# Patient Record
Sex: Female | Born: 1975 | Race: White | Hispanic: No | Marital: Single | Smoking: Never smoker
Health system: Southern US, Community
[De-identification: ages and names within clinical notes are randomized; demographics above are authoritative.]

## PROBLEM LIST (undated history)

## (undated) DIAGNOSIS — G709 Myoneural disorder, unspecified: Secondary | ICD-10-CM

## (undated) DIAGNOSIS — R32 Unspecified urinary incontinence: Secondary | ICD-10-CM

## (undated) DIAGNOSIS — R159 Full incontinence of feces: Secondary | ICD-10-CM

## (undated) DIAGNOSIS — R569 Unspecified convulsions: Secondary | ICD-10-CM

## (undated) DIAGNOSIS — H6690 Otitis media, unspecified, unspecified ear: Secondary | ICD-10-CM

## (undated) DIAGNOSIS — R112 Nausea with vomiting, unspecified: Secondary | ICD-10-CM

## (undated) DIAGNOSIS — F79 Unspecified intellectual disabilities: Secondary | ICD-10-CM

## (undated) DIAGNOSIS — F89 Unspecified disorder of psychological development: Secondary | ICD-10-CM

## (undated) DIAGNOSIS — L858 Other specified epidermal thickening: Secondary | ICD-10-CM

## (undated) DIAGNOSIS — G809 Cerebral palsy, unspecified: Secondary | ICD-10-CM

## (undated) DIAGNOSIS — S82892A Other fracture of left lower leg, initial encounter for closed fracture: Secondary | ICD-10-CM

## (undated) DIAGNOSIS — Z9889 Other specified postprocedural states: Secondary | ICD-10-CM

## (undated) HISTORY — PX: MYRINGOTOMY: SUR874

## (undated) HISTORY — PX: NOVASURE ABLATION: SHX5394

## (undated) HISTORY — PX: DENTAL SURGERY: SHX609

## (undated) HISTORY — PX: WISDOM TOOTH EXTRACTION: SHX21

---

## 2006-01-15 ENCOUNTER — Ambulatory Visit: Payer: Self-pay | Admitting: Unknown Physician Specialty

## 2007-02-23 ENCOUNTER — Ambulatory Visit: Payer: Self-pay | Admitting: Pediatric Dentistry

## 2008-06-08 ENCOUNTER — Ambulatory Visit: Payer: Self-pay | Admitting: Obstetrics and Gynecology

## 2009-09-30 ENCOUNTER — Emergency Department: Payer: Self-pay | Admitting: Emergency Medicine

## 2009-10-09 ENCOUNTER — Ambulatory Visit: Payer: Self-pay | Admitting: Neurology

## 2010-10-18 IMAGING — CT CT HEAD WITHOUT CONTRAST
3 of 4 series · 18 of 30 positions shown, 20 images · non-contrast
Comparison: none

REASON FOR EXAM: new onset seizure
COMMENTS:

[Series 2: without · axial · non-contrast · 0.43mm/px · z∈[+162,+277]mm · 8 of 31 slices shown, 10 images (1 of 2)]
[im 4/31  brain]
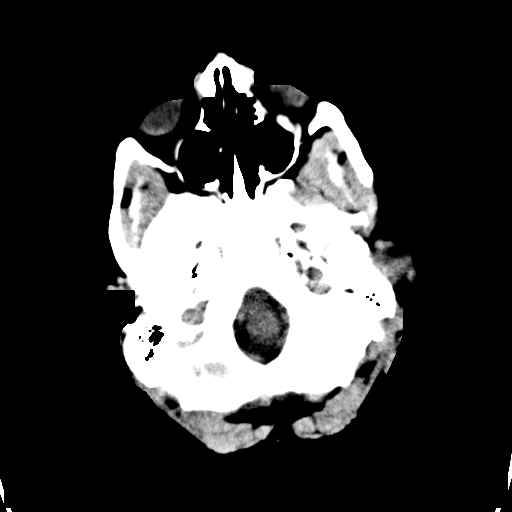
[im 4/31  bone]
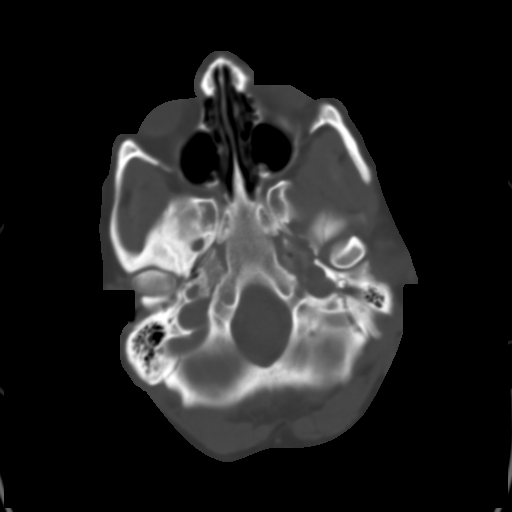
[im 7/31  brain]
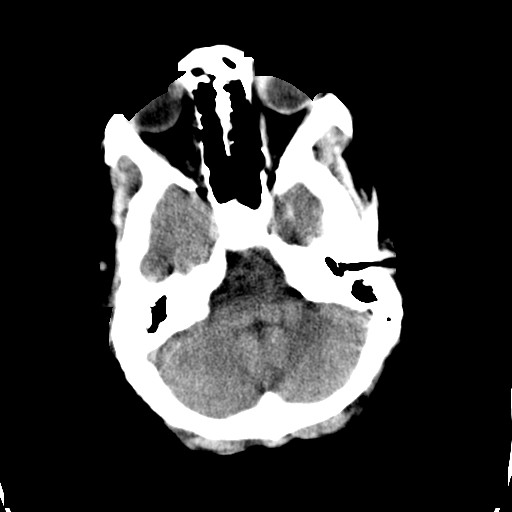
[im 11/31  brain]
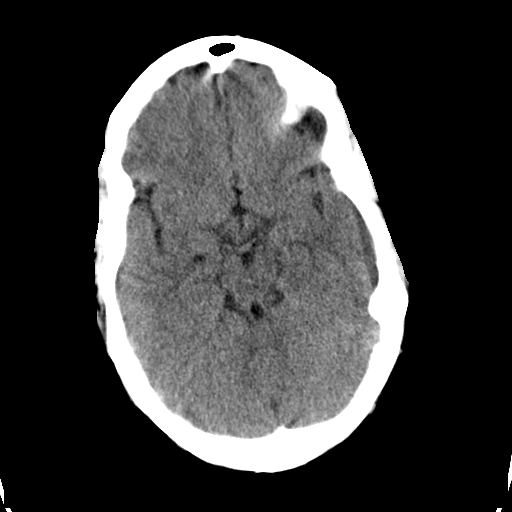
[im 14/31  brain]
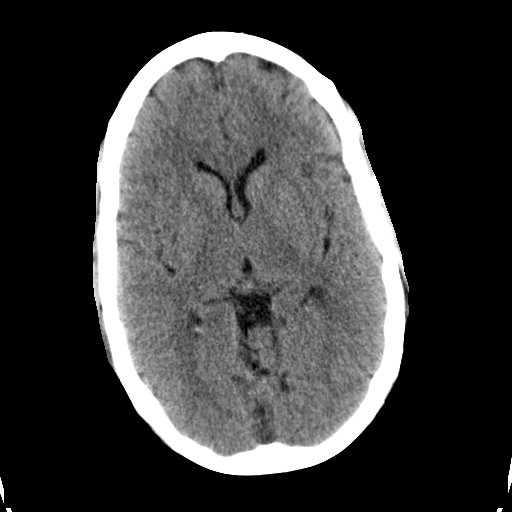
[im 17/31  brain]
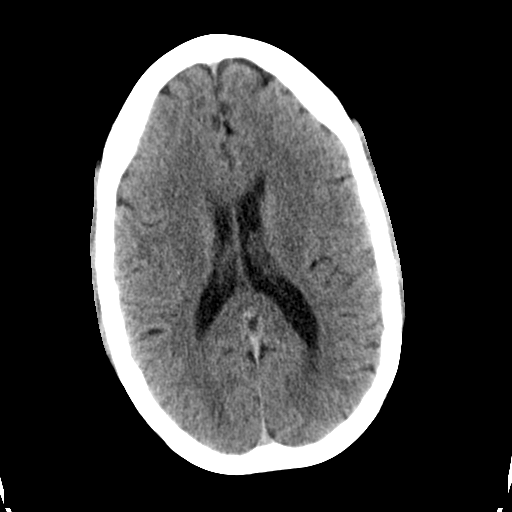
[im 17/31  bone]
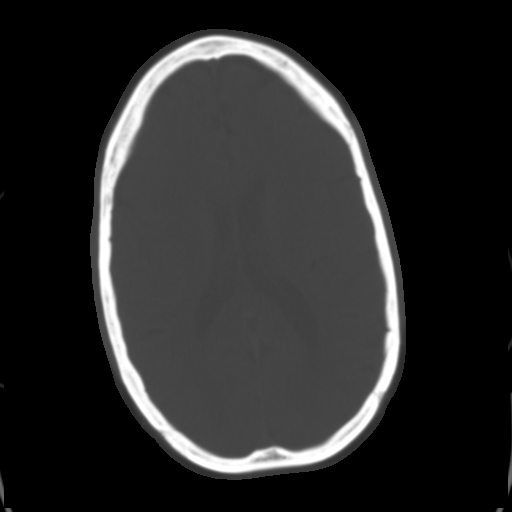
[im 21/31  brain]
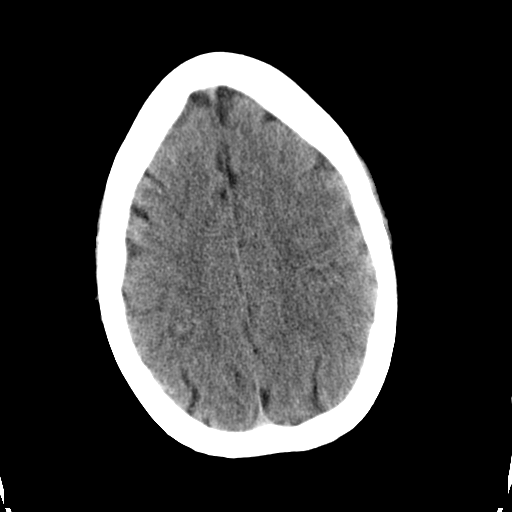
[im 24/31  brain]
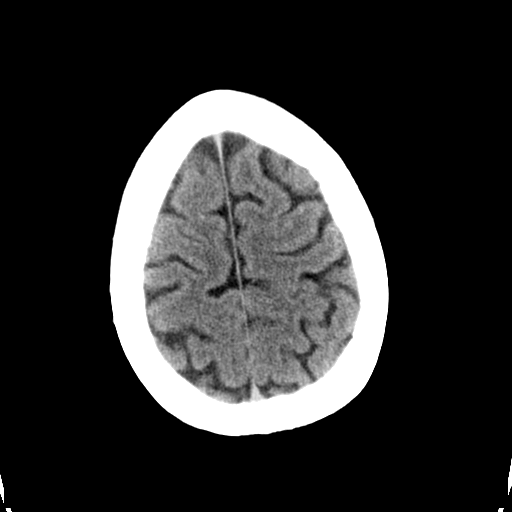
[im 27/31  brain]
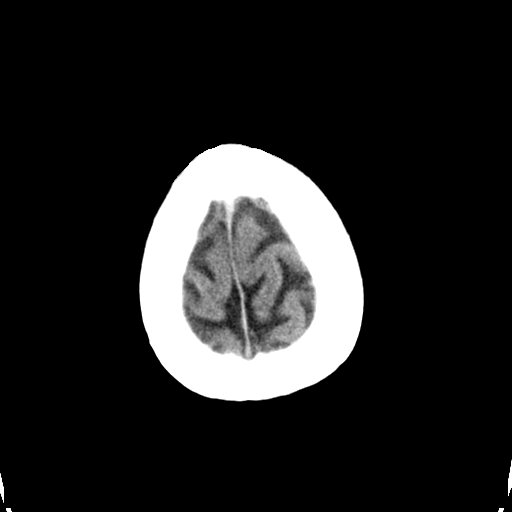

[Series 3: bone · axial · 0.43mm/px · z∈[+162,+262]mm · 7 of 31 slices shown]
[im 4/31  bone]
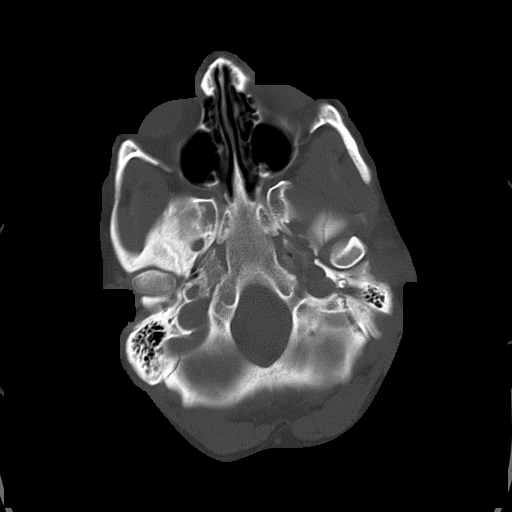
[im 7/31  bone]
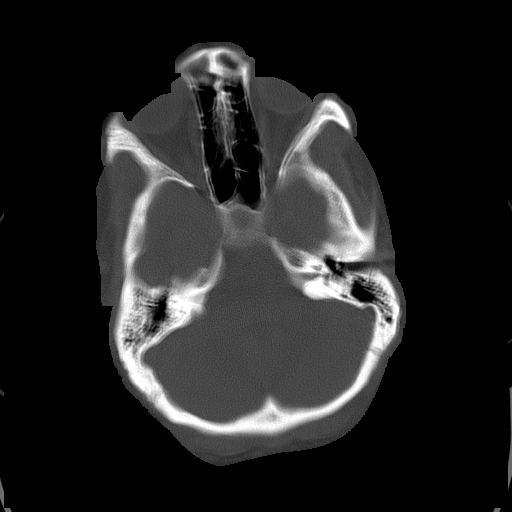
[im 11/31  bone]
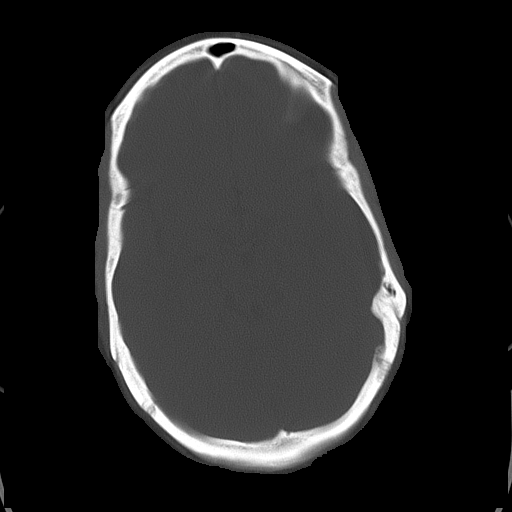
[im 14/31  bone]
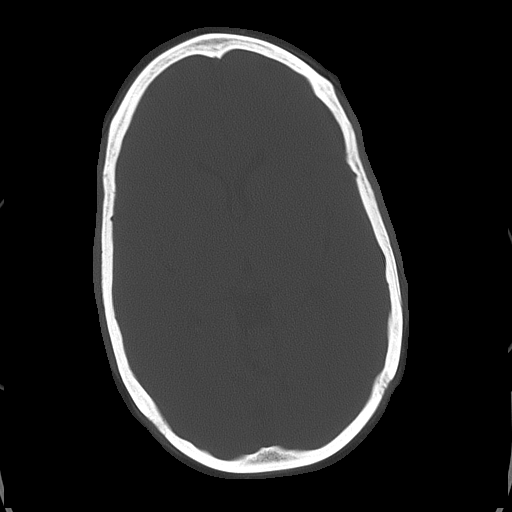
[im 17/31  bone]
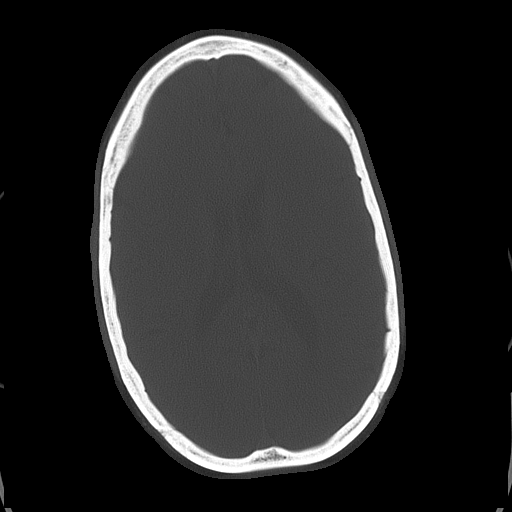
[im 21/31  bone]
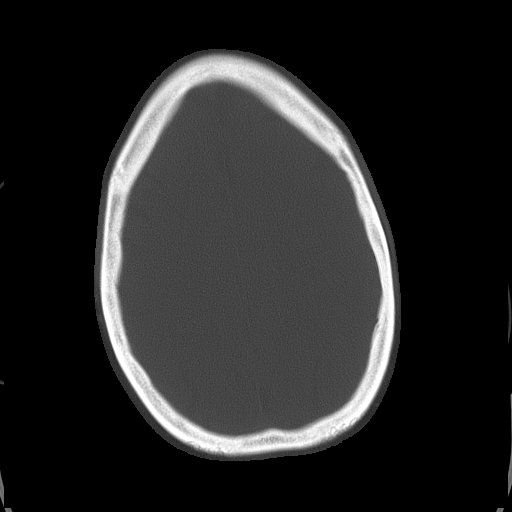
[im 24/31  bone]
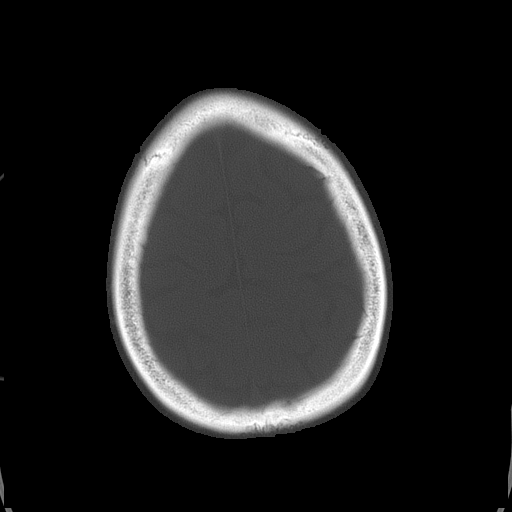

[Series 4: without · axial · non-contrast · 0.43mm/px · z∈[+162,+192]mm · 3 of 14 slices shown (2 of 2)]
[im 4/14  brain]
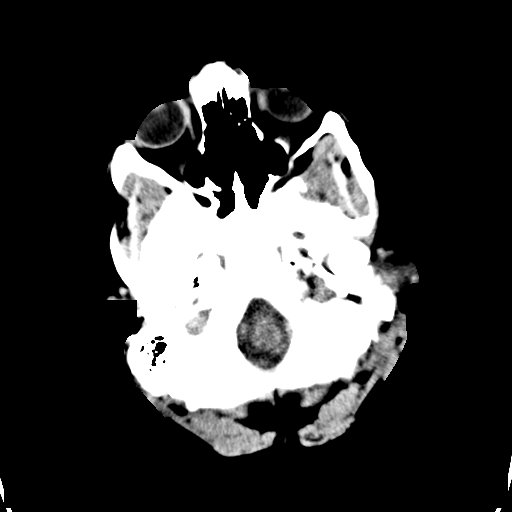
[im 7/14  brain]
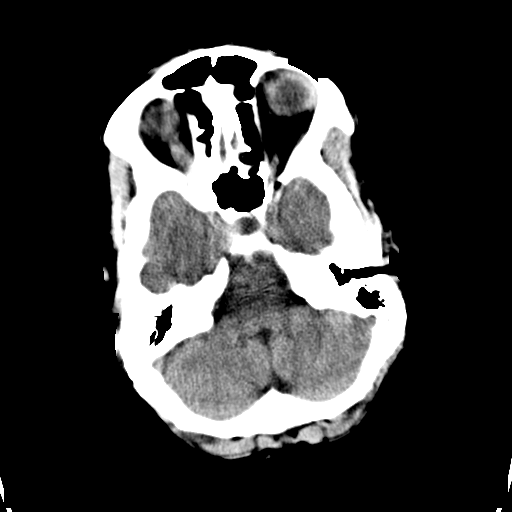
[im 10/14  brain]
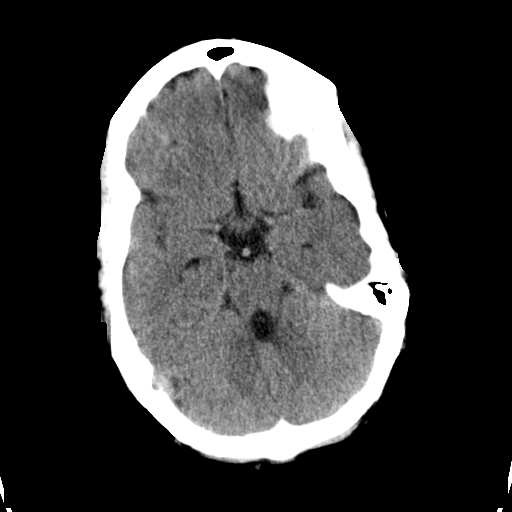

[18 of 30 positions shown; findings below may reference images not displayed]

PROCEDURE:     CT  - CT HEAD WITHOUT CONTRAST  - October 01, 2009 [DATE]

RESULT:     Noncontrast emergent CT of the brain is performed in the
standard fashion. There is no previous examination for comparison.

The ventricles and sulci are normal. There is no hemorrhage. There is no
focal mass, mass-effect or midline shift. There is no evidence of edema or
territorial infarct. The bone windows demonstrate normal aeration of the
paranasal sinuses and mastoid air cells. There is no skull fracture
demonstrated.
IMPRESSION: 1. No acute intracranial abnormality.

## 2011-09-08 ENCOUNTER — Ambulatory Visit: Payer: Self-pay | Admitting: Unknown Physician Specialty

## 2014-12-19 ENCOUNTER — Encounter (HOSPITAL_BASED_OUTPATIENT_CLINIC_OR_DEPARTMENT_OTHER): Payer: Self-pay | Admitting: *Deleted

## 2014-12-21 ENCOUNTER — Ambulatory Visit (HOSPITAL_BASED_OUTPATIENT_CLINIC_OR_DEPARTMENT_OTHER): Admission: RE | Admit: 2014-12-21 | Payer: Medicare Other | Source: Ambulatory Visit | Admitting: Oral Surgery

## 2014-12-21 HISTORY — DX: Myoneural disorder, unspecified: G70.9

## 2014-12-21 HISTORY — DX: Unspecified convulsions: R56.9

## 2014-12-21 HISTORY — DX: Unspecified disorder of psychological development: F89

## 2014-12-21 HISTORY — DX: Other specified epidermal thickening: L85.8

## 2014-12-21 HISTORY — DX: Other fracture of left lower leg, initial encounter for closed fracture: S82.892A

## 2014-12-21 HISTORY — DX: Unspecified intellectual disabilities: F79

## 2014-12-21 HISTORY — DX: Cerebral palsy, unspecified: G80.9

## 2014-12-21 HISTORY — DX: Otitis media, unspecified, unspecified ear: H66.90

## 2014-12-21 SURGERY — MULTIPLE EXTRACTION WITH ALVEOLOPLASTY
Anesthesia: General

## 2014-12-27 ENCOUNTER — Encounter (HOSPITAL_COMMUNITY): Payer: Self-pay | Admitting: Surgery

## 2014-12-27 MED ORDER — LACTATED RINGERS IV SOLN
INTRAVENOUS | Status: DC
  Administered 2014-12-28: 11:00:00 via INTRAVENOUS

## 2014-12-27 MED ORDER — FENTANYL CITRATE 0.05 MG/ML IJ SOLN: 50.0000 ug | INTRAMUSCULAR | Status: DC | PRN

## 2014-12-27 MED ORDER — MIDAZOLAM HCL 2 MG/2ML IJ SOLN: 1.0000 mg | INTRAMUSCULAR | Status: DC | PRN

## 2014-12-28 ENCOUNTER — Encounter (HOSPITAL_COMMUNITY)
Admission: RE | Disposition: A | Discharge status: Home / Self Care | Payer: Self-pay | Source: Ambulatory Visit | Attending: Oral Surgery

## 2014-12-28 ENCOUNTER — Ambulatory Visit (HOSPITAL_COMMUNITY): Payer: Medicare Other | Admitting: Vascular Surgery

## 2014-12-28 ENCOUNTER — Ambulatory Visit (HOSPITAL_COMMUNITY)
Admission: RE | Admit: 2014-12-28 | Discharge: 2014-12-28 | Disposition: A | Discharge status: Home / Self Care | Payer: Medicare Other | Source: Ambulatory Visit | Attending: Oral Surgery | Admitting: Oral Surgery

## 2014-12-28 ENCOUNTER — Encounter (HOSPITAL_COMMUNITY): Payer: Self-pay | Admitting: *Deleted

## 2014-12-28 DIAGNOSIS — E669 Obesity, unspecified: Secondary | ICD-10-CM | POA: Diagnosis not present

## 2014-12-28 DIAGNOSIS — Z79899 Other long term (current) drug therapy: Secondary | ICD-10-CM | POA: Diagnosis not present

## 2014-12-28 DIAGNOSIS — F8181 Disorder of written expression: Secondary | ICD-10-CM | POA: Diagnosis not present

## 2014-12-28 DIAGNOSIS — Z6837 Body mass index (BMI) 37.0-37.9, adult: Secondary | ICD-10-CM | POA: Insufficient documentation

## 2014-12-28 DIAGNOSIS — G809 Cerebral palsy, unspecified: Secondary | ICD-10-CM | POA: Insufficient documentation

## 2014-12-28 DIAGNOSIS — M2629 Other anomalies of dental arch relationship: Secondary | ICD-10-CM | POA: Diagnosis present

## 2014-12-28 DIAGNOSIS — F73 Profound intellectual disabilities: Secondary | ICD-10-CM | POA: Insufficient documentation

## 2014-12-28 HISTORY — DX: Full incontinence of feces: R15.9

## 2014-12-28 HISTORY — DX: Unspecified urinary incontinence: R32

## 2014-12-28 HISTORY — PX: TOOTH EXTRACTION: SHX859

## 2014-12-28 HISTORY — DX: Nausea with vomiting, unspecified: R11.2

## 2014-12-28 HISTORY — DX: Other specified postprocedural states: Z98.890

## 2014-12-28 SURGERY — EXTRACTION, TOOTH, MOLAR
Anesthesia: General | Site: Mouth | Laterality: Right

## 2014-12-28 MED ORDER — ONDANSETRON HCL 4 MG/2ML IJ SOLN
INTRAMUSCULAR | Status: DC | PRN
  Administered 2014-12-28: 4 mg via INTRAVENOUS

## 2014-12-28 MED ORDER — LACTATED RINGERS IV SOLN: INTRAVENOUS | Status: DC

## 2014-12-28 MED ORDER — MIDAZOLAM HCL 5 MG/5ML IJ SOLN
INTRAMUSCULAR | Status: DC | PRN
  Administered 2014-12-28 (×2): 1 mg via INTRAVENOUS

## 2014-12-28 MED ORDER — 0.9 % SODIUM CHLORIDE (POUR BTL) OPTIME
TOPICAL | Status: DC | PRN
  Administered 2014-12-28: 1000 mL

## 2014-12-28 MED ORDER — FENTANYL CITRATE 0.05 MG/ML IJ SOLN
INTRAMUSCULAR | Status: AC
  Filled 2014-12-28: qty 5

## 2014-12-28 MED ORDER — LIDOCAINE-EPINEPHRINE 2 %-1:100000 IJ SOLN
INTRAMUSCULAR | Status: DC | PRN
  Administered 2014-12-28: 5 mL

## 2014-12-28 MED ORDER — MIDAZOLAM HCL 2 MG/2ML IJ SOLN
INTRAMUSCULAR | Status: AC
  Filled 2014-12-28: qty 2

## 2014-12-28 MED ORDER — OXYMETAZOLINE HCL 0.05 % NA SOLN
NASAL | Status: AC
  Filled 2014-12-28: qty 15

## 2014-12-28 MED ORDER — PROPOFOL 10 MG/ML IV BOLUS
INTRAVENOUS | Status: DC | PRN
  Administered 2014-12-28: 130 mg via INTRAVENOUS

## 2014-12-28 MED ORDER — ARTIFICIAL TEARS OP OINT
TOPICAL_OINTMENT | OPHTHALMIC | Status: DC | PRN
  Administered 2014-12-28: 1 via OPHTHALMIC

## 2014-12-28 MED ORDER — LIDOCAINE-EPINEPHRINE 2 %-1:100000 IJ SOLN
INTRAMUSCULAR | Status: AC
Start: 2014-12-28 — End: 2014-12-28
  Filled 2014-12-28: qty 1

## 2014-12-28 MED ORDER — HYDROCODONE-ACETAMINOPHEN 10-300 MG/15ML PO SOLN: 5.0000 mL | ORAL | Status: AC | PRN

## 2014-12-28 MED ORDER — FENTANYL CITRATE 0.05 MG/ML IJ SOLN
INTRAMUSCULAR | Status: DC | PRN
  Administered 2014-12-28: 50 ug via INTRAVENOUS

## 2014-12-28 MED ORDER — LACTATED RINGERS IV SOLN
INTRAVENOUS | Status: DC | PRN
  Administered 2014-12-28: 11:00:00 via INTRAVENOUS

## 2014-12-28 MED ORDER — SUCCINYLCHOLINE CHLORIDE 20 MG/ML IJ SOLN
INTRAMUSCULAR | Status: DC | PRN
  Administered 2014-12-28: 120 mg via INTRAVENOUS

## 2014-12-28 MED ORDER — LIDOCAINE HCL (CARDIAC) 20 MG/ML IV SOLN
INTRAVENOUS | Status: DC | PRN
  Administered 2014-12-28: 80 mg via INTRAVENOUS

## 2014-12-28 SURGICAL SUPPLY — 33 items
BLADE SURG 15 STRL LF DISP TIS (BLADE) IMPLANT
BLADE SURG 15 STRL SS (BLADE)
BUR CROSS CUT (BURR)
BUR CROSS CUT FISSURE 1.6 (BURR) IMPLANT
BUR CROSS CUT FISSURE 1.6MM (BURR)
BUR SRG MED 1.6XXCUT FSSR (BURR) IMPLANT
BURR SRG MED 1.6XXCUT FSSR (BURR)
CANISTER SUCTION 2500CC (MISCELLANEOUS) ×3 IMPLANT
COVER SURGICAL LIGHT HANDLE (MISCELLANEOUS) ×3 IMPLANT
GAUZE PACKING FOLDED 2  STR (GAUZE/BANDAGES/DRESSINGS) ×2
GAUZE PACKING FOLDED 2 STR (GAUZE/BANDAGES/DRESSINGS) ×1 IMPLANT
GAUZE SPONGE 4X4 16PLY XRAY LF (GAUZE/BANDAGES/DRESSINGS) IMPLANT
GLOVE BIO SURGEON STRL SZ 6.5 (GLOVE) ×2 IMPLANT
GLOVE BIO SURGEON STRL SZ7.5 (GLOVE) ×3 IMPLANT
GLOVE BIO SURGEONS STRL SZ 6.5 (GLOVE) ×1
GLOVE BIOGEL PI IND STRL 7.0 (GLOVE) IMPLANT
GLOVE BIOGEL PI INDICATOR 7.0 (GLOVE)
GOWN STRL REUS W/ TWL LRG LVL3 (GOWN DISPOSABLE) ×1 IMPLANT
GOWN STRL REUS W/ TWL XL LVL3 (GOWN DISPOSABLE) ×1 IMPLANT
GOWN STRL REUS W/TWL LRG LVL3 (GOWN DISPOSABLE) ×2
GOWN STRL REUS W/TWL XL LVL3 (GOWN DISPOSABLE) ×2
KIT BASIN OR (CUSTOM PROCEDURE TRAY) ×3 IMPLANT
KIT ROOM TURNOVER OR (KITS) ×3 IMPLANT
NEEDLE 22X1 1/2 (OR ONLY) (NEEDLE) ×3 IMPLANT
NS IRRIG 1000ML POUR BTL (IV SOLUTION) ×3 IMPLANT
PAD ARMBOARD 7.5X6 YLW CONV (MISCELLANEOUS) ×6 IMPLANT
SUT CHROMIC 3 0 PS 2 (SUTURE) ×3 IMPLANT
TOWEL OR 17X26 10 PK STRL BLUE (TOWEL DISPOSABLE) ×3 IMPLANT
TRAY ENT MC OR (CUSTOM PROCEDURE TRAY) ×3 IMPLANT
TUBE CONNECTING 12'X1/4 (SUCTIONS)
TUBE CONNECTING 12X1/4 (SUCTIONS) IMPLANT
TUBING IRRIGATION (MISCELLANEOUS) IMPLANT
YANKAUER SUCT BULB TIP NO VENT (SUCTIONS) ×3 IMPLANT

## 2014-12-28 NOTE — Discharge Instructions (Signed)
What to Eat after Tooth extraction:   ° ° °For your first meals, you should eat lightly; only small meals at first.   Avoid Sharp, Crunchy, and Hot foods.   If you do not have nausea, you may eat larger meals.  Avoid spicy, greasy and heavy food, as these may make you sick after the anesthesia.  ° °Sinus precautions after surgery:  °1. Avoid blowing your nose.  °2. Avoid sneezing. If you might sneeze, Keep her mouth open and do not pinch your nose closed.  °3. Avoid sucking on straws. Avoid smoking.  °4. Do not lift objects weighing more than 20 pounds  °Call if questions arise.  ° °MOUTH CARE AFTER SURGERY  °FACTS:  °Ice used in ice bag helps keep the swelling down, and can help lessen the pain.  °It is easier to treat pain BEFORE it happens.  °Spitting disturbs the clot and may cause bleeding to start again, or to get worse.  °Smoking delays healing and can cause complications.  °Sharing prescriptions can be dangerous. Do not take medications not recently prescribed for you.  °Antibiotics may stop birth control pills from working. Use other means of birth control while on antibiotics.  °Warm salt water rinses after the first 24 hours will help lessen the swelling: Use 1/2 teaspoonful of table salt per oz.of water. °DO NOT:  °Do not spit. Do not drink through a straw.  °Strongly advised not to smoke, dip snuff or chew tobacco at least for 3 days.  °Do not eat sharp or crunchy foods. Avoid the area of surgery when chewing.  °Do not stop your antibiotics before your instructions say to do so.  °Do not eat hot foods until bleeding has stopped. If you need to, let your food cool down to room temperature. °EXPECT:  °Some swelling, especially first 2-3 days.  °Soreness or discomfort in varying degrees. Follow your dentist's instructions about how to handle pain before it starts.  °Pinkish saliva or light blood in saliva, or on your pillow in the morning. This can last around 24 hours.  °Bruising inside or outside the  mouth. This may not show up until 2-3 days after surgery. Don't worry, it will go away in time.  °Pieces of "bone" may work themselves loose. It's OK. If they bother you, let us know. °WHAT TO DO IMMEDIATELY AFTER SURGERY:  °Bite on the gauze with steady pressure for 1-2 hours. Don't chew on the gauze.  °Do not lie down flat. Raise your head support especially for the first 24 hours.  °Apply ice to your face on the side of the surgery. You may apply it 20 minutes on and a few minutes off. Ice for 8-12 hours. You may use ice up to 24 hours.  °Before the numbness wears off, take a pain pill as instructed.  °Prescription pain medication is not always required. °SWELLING:  °Expect swelling for the first couple of days. It should get better after that.  °If swelling increases 3 days or so after surgery; let us know as soon as possible. °FEVER:  °Take Tylenol every 4 hours if needed to lower your temperature, especially if it is at 100F or higher.  °Drink lots of fluids.  °If the fever does not go away, let us know. °BREATHING TROUBLE:  °Any unusual difficulty breathing means you have to have someone bring you to the emergency room ASAP °BLEEDING:  °Light oozing is expected for 24 hours or so.  °Prop head up with   pillows  °Avoid spitting  °Do not confuse bright red fresh flowing blood with lots of saliva colored with a little bit of blood.  °If you notice some bleeding, place gauze or a tea bag where it is bleeding and apply CONSTANT pressure by biting down for 1 hour. Avoid talking during this time. Do not remove the gauze or tea bag during this hour to "check" the bleeding.  °If you notice bright RED bleeding FLOWING out of particular area, and filling the floor of your mouth, put a wad of gauze on that area, bite down firmly and constantly. Call us immediately. If we're closed, have someone bring you to the emergency room. °ORAL HYGIENE:  °Brush your teeth as usual after meals and before bedtime.  °Use a soft  toothbrush around the area of surgery.  °DO NOT AVOID BRUSHING. Otherwise bacteria(germs) will grow and may delay healing or encourage infection.  °Since you cannot spit, just gently rinse and let the water flow out of your mouth.  °DO NOT SWISH HARD. °EATING:  °Cool liquids are a good point to start. Increase to soft foods as tolerated. °PRESCRIPTIONS:  °Follow the directions for your prescriptions exactly as written.  °If your doctor gave you a narcotic pain medication, do not drive, operate machinery or drink alcohol when on that medication. °QUESTIONS:  °Call our office during office hours ° °   ° °General Anesthesia, Adult, Care After  °Refer to this sheet in the next few weeks. These instructions provide you with information on caring for yourself after your procedure. Your health care provider may also give you more specific instructions. Your treatment has been planned according to current medical practices, but problems sometimes occur. Call your health care provider if you have any problems or questions after your procedure.  °WHAT TO EXPECT AFTER THE PROCEDURE  °After the procedure, it is typical to experience:  °Sleepiness.  °Nausea and vomiting. °HOME CARE INSTRUCTIONS  °For the first 24 hours after general anesthesia:  °Have a responsible person with you.  °Do not drive a car. If you are alone, do not take public transportation.  °Do not drink alcohol.  °Do not take medicine that has not been prescribed by your health care provider.  °Do not sign important papers or make important decisions.  °You may resume a normal diet and activities as directed by your health care provider.  °Change bandages (dressings) as directed.  °If you have questions or problems that seem related to general anesthesia, call the hospital and ask for the anesthetist or anesthesiologist on call. °SEEK MEDICAL CARE IF:  °You have nausea and vomiting that continue the day after anesthesia.  °You develop a rash. °SEEK IMMEDIATE  MEDICAL CARE IF:  °You have difficulty breathing.  °You have chest pain.  °You have any allergic problems. °Document Released: 02/22/2001 Document Revised: 07/19/2013 Document Reviewed: 06/01/2013  °[employer]® Patient Information ©2014 [employer].  ° ° °

## 2014-12-28 NOTE — Anesthesia Postprocedure Evaluation (Signed)
  Anesthesia Post-op Note  Patient: Summer Preston  Procedure(s) Performed: Procedure(s): EXTRACTION MOLAR RIGHT SIDE (Right)  Patient Location: PACU  Anesthesia Type:General  Level of Consciousness: awake and alert   Airway and Oxygen Therapy: Patient Spontanous Breathing  Post-op Pain: mild  Post-op Assessment: Post-op Vital signs reviewed  Post-op Vital Signs: stable  Last Vitals:  Filed Vitals:   12/28/14 1134  BP:   Pulse: 88  Temp:   Resp: 15    Complications: No apparent anesthesia complications

## 2014-12-28 NOTE — Progress Notes (Signed)
Pulling off o2 sat and BP cuff /  Follows person interacting with her with eyes, otherwise is quiet

## 2014-12-28 NOTE — Anesthesia Preprocedure Evaluation (Addendum)
Anesthesia Evaluation  Patient identified by MRN, date of birth, ID band Patient confused  General Assessment Comment:Patient is uncooperative and refuses to allow IV start. Mother is present  History of Anesthesia Complications (+) PONV  Airway Mallampati: II   Neck ROM: Full    Dental  (+) Teeth Intact   Pulmonary neg pulmonary ROS,  breath sounds clear to auscultation        Cardiovascular negative cardio ROS  Rhythm:Regular Rate:Normal     Neuro/Psych Seizures -,  Cerebral palsy, mental disability  Neuromuscular disease    GI/Hepatic   Endo/Other    Renal/GU      Musculoskeletal   Abdominal (+) + obese,   Peds  Hematology   Anesthesia Other Findings   Reproductive/Obstetrics                             Anesthesia Physical Anesthesia Plan  ASA: III  Anesthesia Plan: General   Post-op Pain Management:    Induction: Intravenous  Airway Management Planned: Oral ETT  Additional Equipment:   Intra-op Plan:   Post-operative Plan: Extubation in OR  Informed Consent: I have reviewed the patients History and Physical, chart, labs and discussed the procedure including the risks, benefits and alternatives for the proposed anesthesia with the patient or authorized representative who has indicated his/her understanding and acceptance.   Dental advisory given  Plan Discussed with: CRNA, Anesthesiologist and Surgeon  Anesthesia Plan Comments:         Anesthesia Quick Evaluation

## 2014-12-28 NOTE — H&P (Signed)
HISTORY AND PHYSICAL  Summer Preston is a 39 y.o. female patient referred by general dentist for dental extraction.  No diagnosis found.  Past Medical History  Diagnosis Date  . Seizures   . Neuromuscular disorder     cerebral palsey  . CP (cerebral palsy)   . Otitis media   . Developmental disability     severe  . Mental disability     profound  . Keratosis pilaris   . Closed left ankle fracture     currantly in splint  . PONV (postoperative nausea and vomiting)   . Incontinent of feces   . Incontinent of urine     Current Facility-Administered Medications  Medication Dose Route Frequency Provider Last Rate Last Dose  . fentaNYL (SUBLIMAZE) injection 50-100 mcg  50-100 mcg Intravenous PRN Kerby Noraavid A Crews, MD      . lactated ringers infusion   Intravenous Continuous Kerby Noraavid A Crews, MD      . midazolam (VERSED) injection 1-2 mg  1-2 mg Intravenous PRN Kerby Noraavid A Crews, MD       Allergies  Allergen Reactions  . Augmentin [Amoxicillin-Pot Clavulanate] Hives  . Clindamycin/Lincomycin Hives   Active Problems:   * No active hospital problems. *  Vitals: Pulse 79, temperature 97.9 F (36.6 C), temperature source Axillary, SpO2 99 %. Lab results:No results found for this or any previous visit (from the past 24 hour(s)). Radiology Results: No results found. General appearance: slowed mentation and uncooperative Head: Normocephalic, without obvious abnormality, atraumatic Eyes: negative Nose: Nares normal. Septum midline. Mucosa normal. No drainage or sinus tenderness. Throat: lips, mucosa, and tongue normal; teeth and gums normal and stainless steel crown lower right molar. limited exam secondary to patient uncooperative Neck: no adenopathy Resp: clear to auscultation bilaterally Cardio: regular rate and rhythm, S1, S2 normal, no murmur, click, rub or gallop  Assessment: Nonrestorable tooth #31  Plan: EUA. Extraction 31 and others as necessary. General anesthesia. Day  surgery.   Georgia LopesJENSEN,Eashan Schipani M 12/28/2014

## 2014-12-28 NOTE — Op Note (Signed)
12/28/2014  11:03 AM  PATIENT:  Summer CrewsKelly R Hosea  39 y.o. female  PRE-OPERATIVE DIAGNOSIS:  NON RESTORABLE TOOTH #31 POST-OP DIAGNOSIS: SAME  PROCEDURE: EUA, EXTRACTION TOOTH #31  SURGEON:  Surgeon(s): Georgia LopesScott M Asif Muchow, DDS  ANESTHESIA:   local and general  EBL:  minimal  DRAINS: none   SPECIMEN:  No Specimen  COUNTS:  YES  PLAN OF CARE: Discharge to home after PACU  PATIENT DISPOSITION:  PACU - hemodynamically stable.   PROCEDURE DETAILS: Dictation #[id number]  Georgia LopesScott M. Jodye Scali, DMD 12/28/2014 11:03 AM

## 2014-12-28 NOTE — Progress Notes (Addendum)
Unable to start Iv Dr Massage informed. Unable to put arm bands on her arms, Placed on her back. Unable to remove her pants, Dr Massage informed and he states ok to leave on for now.

## 2014-12-28 NOTE — Anesthesia Procedure Notes (Signed)
Procedure Name: Intubation Date/Time: 12/28/2014 10:49 AM Performed by: Angelica PouSMITH, Eda Magnussen PIZZICARA Pre-anesthesia Checklist: Patient identified, Timeout performed, Emergency Drugs available, Suction available and Patient being monitored Patient Re-evaluated:Patient Re-evaluated prior to inductionOxygen Delivery Method: Circle system utilized Preoxygenation: Pre-oxygenation with 100% oxygen Intubation Type: IV induction Ventilation: Mask ventilation without difficulty Laryngoscope size: Small adult glidescope blade. Grade View: Grade I Tube type: Oral Tube size: 7.0 mm Number of attempts: 1 Airway Equipment and Method: Video-laryngoscopy and Rigid stylet Placement Confirmation: ETT inserted through vocal cords under direct vision,  breath sounds checked- equal and bilateral and positive ETCO2 Secured at: 21 cm Tube secured with: Tape Dental Injury: Teeth and Oropharynx as per pre-operative assessment  Comments: Elective glidescope use d/t suboptimal positioning

## 2014-12-28 NOTE — Transfer of Care (Signed)
Immediate Anesthesia Transfer of Care Note  Patient: Summer Preston  Procedure(s) Performed: Procedure(s): EXTRACTION MOLAR RIGHT SIDE (Right)  Patient Location: PACU  Anesthesia Type:General  Level of Consciousness: awake, alert  and patient cooperative  Airway & Oxygen Therapy: Patient Spontanous Breathing and Patient connected to face mask oxygen  Post-op Assessment: Report given to RN, Post -op Vital signs reviewed and stable and Patient moving all extremities  Post vital signs: Reviewed and stable  Last Vitals:  Filed Vitals:   12/28/14 1120  Pulse:   Temp: 36.2 C    Complications: No apparent anesthesia complications

## 2014-12-29 NOTE — Op Note (Signed)
[employer]:  Duffner, Evette                ACCOUNT NO.:  [id number]  MEDICAL RECORD NO.:  [id number]  LOCATION:  [facility]                         FACILITY:  [facility]  PHYSICIAN:  Georgia LopesScott M. Itsel Opfer, M.D.  DATE OF BIRTH:  1976-05-28  DATE OF PROCEDURE:  12/28/2014 DATE OF DISCHARGE:  12/28/2014                              OPERATIVE REPORT   PREOPERATIVE DIAGNOSIS:  Nonrestorable tooth number #31.  POSTOPERATIVE DIAGNOSIS:  Nonrestorable tooth number #31.  PROCEDURE:  Extraction, tooth #31.  Exam under anesthesia.  SURGEON:  Georgia LopesScott M. Emmanuell Kantz, M.D.  ANESTHESIA:  General oral intubation, Dr. Jacklynn BueMassagee, attending.  DESCRIPTION OF PROCEDURE:  The patient was taken to the operating room, placed on the table in supine position.  General anesthesia was administered intravenously and an oral endotracheal tube was placed and secured.  The eyes were protected and the patient was draped for the procedure.  The posterior pharynx was suctioned and a throat pack was placed.  2% lidocaine with 1:100,000 epinephrine was infiltrated in an inferior alveolar block on the right side and buccal infiltration was given as well, total of 5 mL was utilized.  The posterior pharynx was suctioned prior to injection of local anesthetic and a throat pack was placed.  Then, a 15 blade was used to make an incision around tooth #31, which was a stainless steel crown, which had some purulent exudate and buccal fistula.  The periosteum was reflected and then a 301 elevator was used to elevate the tooth and the tooth was removed using the lower universal forceps.  The socket was curetted, then irrigated and closed with 3-0 chromic.  Then, the remainder of the oral cavity was examined. No additional carious teeth were found nor were any lesions.  The oral cavity was irrigated and suctioned.  The throat pack was removed.  The patient was awakened, extubated, and taken to recovery room, breathing spontaneously in good  condition.  ESTIMATED BLOOD LOSS:  Minimum.  COMPLICATIONS:  None.  SPECIMENS:  None.     Georgia LopesScott M. Zipporah Finamore, M.D.    SMJ/MEDQ  D:  12/28/2014  T:  12/29/2014  Job:  [id number]

## 2014-12-31 ENCOUNTER — Encounter (HOSPITAL_COMMUNITY): Payer: Self-pay | Admitting: Oral Surgery

## 2024-04-20 ENCOUNTER — Encounter: Payer: MEDICAID | Admitting: Advanced Practice Midwife

## 2024-04-20 ENCOUNTER — Encounter: Payer: Self-pay | Admitting: Advanced Practice Midwife

## 2024-04-21 NOTE — Progress Notes (Signed)
 This encounter was created in error - please disregard.
